# Patient Record
Sex: Male | Born: 1970 | Race: White | Hispanic: No | Marital: Single | Smoking: Current every day smoker
Health system: Southern US, Community
[De-identification: ages and names within clinical notes are randomized; demographics above are authoritative.]

## PROBLEM LIST (undated history)

## (undated) DIAGNOSIS — Z86718 Personal history of other venous thrombosis and embolism: Secondary | ICD-10-CM

---

## 2003-12-07 ENCOUNTER — Encounter: Payer: Self-pay | Admitting: Emergency Medicine

## 2003-12-07 ENCOUNTER — Emergency Department (HOSPITAL_COMMUNITY): Admission: EM | Admit: 2003-12-07 | Discharge: 2003-12-07 | Payer: Self-pay | Admitting: Emergency Medicine
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
## 2005-12-10 ENCOUNTER — Emergency Department: Payer: Self-pay | Admitting: Emergency Medicine

## 2010-02-04 ENCOUNTER — Emergency Department (HOSPITAL_COMMUNITY): Admission: EM | Admit: 2010-02-04 | Discharge: 2010-02-04 | Payer: Self-pay | Admitting: Emergency Medicine

## 2010-05-13 ENCOUNTER — Emergency Department (HOSPITAL_COMMUNITY): Admission: EM | Admit: 2010-05-13 | Discharge: 2010-05-13 | Payer: Self-pay | Admitting: Emergency Medicine

## 2010-10-23 ENCOUNTER — Emergency Department (HOSPITAL_COMMUNITY)
Admission: EM | Admit: 2010-10-23 | Discharge: 2010-10-23 | Disposition: A | Discharge status: Home / Self Care | Payer: No Typology Code available for payment source | Attending: Emergency Medicine | Admitting: Emergency Medicine

## 2010-10-23 DIAGNOSIS — Y92410 Unspecified street and highway as the place of occurrence of the external cause: Secondary | ICD-10-CM | POA: Insufficient documentation

## 2010-10-23 DIAGNOSIS — M545 Low back pain, unspecified: Secondary | ICD-10-CM | POA: Insufficient documentation

## 2010-10-23 DIAGNOSIS — M549 Dorsalgia, unspecified: Secondary | ICD-10-CM | POA: Insufficient documentation

## 2010-10-23 DIAGNOSIS — G8929 Other chronic pain: Secondary | ICD-10-CM | POA: Insufficient documentation

## 2010-10-23 DIAGNOSIS — T1490XA Injury, unspecified, initial encounter: Secondary | ICD-10-CM | POA: Insufficient documentation

## 2010-11-22 LAB — CBC
HCT: 47.8 % (ref 39.0–52.0)
MCH: 33.2 pg (ref 26.0–34.0)
MCHC: 34.5 g/dL (ref 30.0–36.0)
Platelets: 256 10*3/uL (ref 150–400)
RBC: 4.97 MIL/uL (ref 4.22–5.81)
RDW: 13.5 % (ref 11.5–15.5)
WBC: 11.4 10*3/uL — ABNORMAL HIGH (ref 4.0–10.5)

## 2010-11-22 LAB — POCT I-STAT, CHEM 8
BUN: 9 mg/dL (ref 6–23)
Calcium, Ion: 1.18 mmol/L (ref 1.12–1.32)
Creatinine, Ser: 1 mg/dL (ref 0.4–1.5)
Glucose, Bld: 129 mg/dL — ABNORMAL HIGH (ref 70–99)
HCT: 53 % — ABNORMAL HIGH (ref 39.0–52.0)
Hemoglobin: 18 g/dL — ABNORMAL HIGH (ref 13.0–17.0)
TCO2: 27 mmol/L (ref 0–100)

## 2010-11-22 LAB — URINALYSIS, ROUTINE W REFLEX MICROSCOPIC
Glucose, UA: NEGATIVE mg/dL
Ketones, ur: 40 mg/dL — AB
Specific Gravity, Urine: 1.022 (ref 1.005–1.030)
pH: 6.5 (ref 5.0–8.0)

## 2010-11-22 LAB — DIFFERENTIAL
Basophils Absolute: 0 10*3/uL (ref 0.0–0.1)
Lymphocytes Relative: 14 % (ref 12–46)
Lymphs Abs: 1.6 10*3/uL (ref 0.7–4.0)

## 2010-11-22 LAB — URINE MICROSCOPIC-ADD ON

## 2010-11-22 LAB — URINE CULTURE

## 2011-06-26 ENCOUNTER — Emergency Department (HOSPITAL_COMMUNITY)
Admission: EM | Admit: 2011-06-26 | Discharge: 2011-06-27 | Disposition: A | Discharge status: Home / Self Care | Payer: Self-pay | Attending: Emergency Medicine | Admitting: Emergency Medicine

## 2011-06-26 DIAGNOSIS — M542 Cervicalgia: Secondary | ICD-10-CM | POA: Insufficient documentation

## 2011-06-26 DIAGNOSIS — IMO0001 Reserved for inherently not codable concepts without codable children: Secondary | ICD-10-CM | POA: Insufficient documentation

## 2011-06-26 DIAGNOSIS — Z86718 Personal history of other venous thrombosis and embolism: Secondary | ICD-10-CM | POA: Insufficient documentation

## 2011-06-26 DIAGNOSIS — M79609 Pain in unspecified limb: Secondary | ICD-10-CM | POA: Insufficient documentation

## 2011-06-27 ENCOUNTER — Ambulatory Visit (HOSPITAL_COMMUNITY)
Admission: RE | Admit: 2011-06-27 | Discharge: 2011-06-27 | Disposition: A | Discharge status: Home / Self Care | Payer: Self-pay | Source: Ambulatory Visit | Attending: Emergency Medicine | Admitting: Emergency Medicine

## 2011-06-27 DIAGNOSIS — M542 Cervicalgia: Secondary | ICD-10-CM | POA: Insufficient documentation

## 2011-06-27 DIAGNOSIS — M79609 Pain in unspecified limb: Secondary | ICD-10-CM

## 2011-06-27 LAB — POCT I-STAT, CHEM 8
Chloride: 103 mEq/L (ref 96–112)
Creatinine, Ser: 0.8 mg/dL (ref 0.50–1.35)
HCT: 44 % (ref 39.0–52.0)
Hemoglobin: 15 g/dL (ref 13.0–17.0)
Potassium: 3.8 mEq/L (ref 3.5–5.1)
Sodium: 139 mEq/L (ref 135–145)

## 2011-06-27 LAB — CBC
HCT: 40.6 % (ref 39.0–52.0)
Hemoglobin: 14.2 g/dL (ref 13.0–17.0)
MCV: 92.9 fL (ref 78.0–100.0)
Platelets: 208 10*3/uL (ref 150–400)
RBC: 4.37 MIL/uL (ref 4.22–5.81)
WBC: 7.2 10*3/uL (ref 4.0–10.5)

## 2013-08-21 ENCOUNTER — Encounter (HOSPITAL_COMMUNITY): Payer: Self-pay | Admitting: Emergency Medicine

## 2013-08-21 ENCOUNTER — Emergency Department (HOSPITAL_COMMUNITY)
Admission: EM | Admit: 2013-08-21 | Discharge: 2013-08-22 | Disposition: A | Discharge status: Home / Self Care | Payer: Self-pay | Attending: Emergency Medicine | Admitting: Emergency Medicine

## 2013-08-21 DIAGNOSIS — S7000XA Contusion of unspecified hip, initial encounter: Secondary | ICD-10-CM | POA: Insufficient documentation

## 2013-08-21 DIAGNOSIS — Y929 Unspecified place or not applicable: Secondary | ICD-10-CM | POA: Insufficient documentation

## 2013-08-21 DIAGNOSIS — F1010 Alcohol abuse, uncomplicated: Secondary | ICD-10-CM | POA: Insufficient documentation

## 2013-08-21 DIAGNOSIS — Y939 Activity, unspecified: Secondary | ICD-10-CM | POA: Insufficient documentation

## 2013-08-21 DIAGNOSIS — F10929 Alcohol use, unspecified with intoxication, unspecified: Secondary | ICD-10-CM

## 2013-08-21 DIAGNOSIS — X58XXXA Exposure to other specified factors, initial encounter: Secondary | ICD-10-CM | POA: Insufficient documentation

## 2013-08-21 LAB — CBC WITH DIFFERENTIAL/PLATELET
Basophils Absolute: 0 10*3/uL (ref 0.0–0.1)
Basophils Relative: 0 % (ref 0–1)
Eosinophils Absolute: 0.4 10*3/uL (ref 0.0–0.7)
Eosinophils Relative: 4 % (ref 0–5)
Lymphs Abs: 3.2 10*3/uL (ref 0.7–4.0)
MCH: 32.5 pg (ref 26.0–34.0)
MCV: 91.6 fL (ref 78.0–100.0)
Monocytes Absolute: 0.5 10*3/uL (ref 0.1–1.0)
Neutrophils Relative %: 53 % (ref 43–77)
Platelets: 219 10*3/uL (ref 150–400)
RBC: 4.98 MIL/uL (ref 4.22–5.81)
RDW: 12.9 % (ref 11.5–15.5)
WBC: 8.7 10*3/uL (ref 4.0–10.5)

## 2013-08-21 MED ORDER — SODIUM CHLORIDE 0.9 % IV BOLUS (SEPSIS)
1000.0000 mL | Freq: Once | INTRAVENOUS | Status: AC
  Administered 2013-08-21: 1000 mL via INTRAVENOUS

## 2013-08-21 NOTE — ED Notes (Signed)
Patient undressed, rectal temperature checked, patient arousable with stimulation, remains unable to properly answer questions or follow commands. Rectal temp checked. Patient belongings placed in patient belonging bag (remains at bedside with coat). Patient with cell phone and wallet placed at nurses station, searched for identification (possible name [name] or Brett Manning-- registration notified).

## 2013-08-21 NOTE — ED Provider Notes (Addendum)
CSN: [id number]     Arrival date & time 08/21/13  2255 History   First MD Initiated Contact with Patient 08/21/13 2258     Chief Complaint  Patient presents with  . Alcohol Intoxication   (Consider location/radiation/quality/duration/timing/severity/associated sxs/prior Treatment) HPI Comments: Pt found by EMS on [place] sidewalk [place].  They state he smells of etoh but no other information known.  Patient is a 42 y.o. male presenting with intoxication. The history is provided by the EMS personnel. The history is limited by the condition of the patient and the absence of a caregiver.  Alcohol Intoxication    History reviewed. No pertinent past medical history. History reviewed. No pertinent past surgical history. No family history on file. History  Substance Use Topics  . Smoking status: Not on file  . Smokeless tobacco: Not on file  . Alcohol Use: Yes    Review of Systems  Unable to perform ROS   Allergies  Review of patient's allergies indicates not on file.  Home Medications  No current outpatient prescriptions on file. BP 91/54  Pulse 72  Resp 18  SpO2 100% Physical Exam  Nursing note and vitals reviewed. Constitutional: He appears well-developed and well-nourished. No distress.  HENT:  Head: Normocephalic and atraumatic.  Mouth/Throat: Oropharynx is clear and moist.  Eyes: Conjunctivae and EOM are normal. Pupils are equal, round, and reactive to light.  Neck: Normal range of motion. Neck supple.  Cardiovascular: Normal rate, regular rhythm and intact distal pulses.   No murmur heard. Pulmonary/Chest: Effort normal and breath sounds normal. No respiratory distress. He has no wheezes. He has no rales.  Abdominal: Soft. He exhibits no distension. There is no tenderness. There is no rebound and no guarding.  Musculoskeletal: Normal range of motion. He exhibits no edema and no tenderness.  Small ecchymosis over the left hip  Neurological:  Will open eyes with  painful stimuli  Skin: Skin is warm and dry. No rash noted. No erythema.  Psychiatric: He has a normal mood and affect. His behavior is normal.    ED Course  Procedures (including critical care time) Labs Review Labs Reviewed  ETHANOL - Abnormal; Notable for the following:    Alcohol, Ethyl (B) 411 (*)    All other components within normal limits  SALICYLATE LEVEL - Abnormal; Notable for the following:    Salicylate Lvl <2.0 (*)    All other components within normal limits  CBC WITH DIFFERENTIAL  URINE RAPID DRUG SCREEN (HOSP PERFORMED)  ACETAMINOPHEN LEVEL   Imaging Review No results found.  EKG Interpretation   None       MDM   1. Alcohol intoxication     Patient presents by EMS after being found on the sidewalk. Patient appears intoxicated does move all extremities and will open his eyes briefly but does not speak. Only a small bruise on his left hip but no other signs of trauma. Pupils are reactive. Suspect that patient is severely intoxicated also possible other drugs involved. Patient given IV bolus given blood pressure 91/54 but heart rate is stable and oxygen is 100%.  6:17 AM Pt is now sober and awake and denies SI/HI or OD but states he just drank to much.  Pt d/ced home.  Gwyneth Sprout, MD 08/22/13 7829  Gwyneth Sprout, MD 08/22/13 [phone]
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
## 2013-08-22 LAB — ETHANOL: Alcohol, Ethyl (B): 411 mg/dL (ref 0–11)

## 2013-08-22 LAB — RAPID URINE DRUG SCREEN, HOSP PERFORMED: Opiates: NOT DETECTED

## 2013-08-22 LAB — ACETAMINOPHEN LEVEL: Acetaminophen (Tylenol), Serum: <15 ug/mL (ref 10–30)

## 2013-08-22 LAB — SALICYLATE LEVEL: Salicylate Lvl: <2 mg/dL — ABNORMAL LOW (ref 2.8–20.0)

## 2013-08-22 NOTE — ED Notes (Addendum)
Patient d/c'd IV, ambulated to sink where he  voided. Patient returned to bed with assistance from nurse. Patient awake and alert. Name and DOB verified. Patient states he was drinking tonight when questioned how much he states "not very much", unable to recall where he was at, denies drug use. Denies SI/HI. Patient is not seeking detox.

## 2013-08-22 NOTE — ED Notes (Signed)
Patient remains in the bed, eyes closed, chest observed for rise and fall.

## 2013-08-22 NOTE — ED Notes (Signed)
Patient was found sitting in the floor of his room, naked. Patient had removed his gown and gotten up from his bed. Patient was returned to his bed with the assistance of 4 staff members. Patient remains uncooperative and unable to follow commands.

## 2013-08-22 NOTE — ED Notes (Signed)
Bed: WA10 Expected date:  Expected time:  Means of arrival:  Comments: 

## 2013-12-21 ENCOUNTER — Emergency Department (HOSPITAL_COMMUNITY)
Admission: EM | Admit: 2013-12-21 | Discharge: 2013-12-21 | Disposition: A | Discharge status: Home / Self Care | Payer: Self-pay | Attending: Emergency Medicine | Admitting: Emergency Medicine

## 2013-12-21 ENCOUNTER — Encounter (HOSPITAL_COMMUNITY): Payer: Self-pay | Admitting: Emergency Medicine

## 2013-12-21 ENCOUNTER — Emergency Department (HOSPITAL_COMMUNITY): Payer: Self-pay

## 2013-12-21 DIAGNOSIS — Y929 Unspecified place or not applicable: Secondary | ICD-10-CM | POA: Insufficient documentation

## 2013-12-21 DIAGNOSIS — IMO0002 Reserved for concepts with insufficient information to code with codable children: Secondary | ICD-10-CM | POA: Insufficient documentation

## 2013-12-21 DIAGNOSIS — Y990 Civilian activity done for income or pay: Secondary | ICD-10-CM | POA: Insufficient documentation

## 2013-12-21 DIAGNOSIS — Y939 Activity, unspecified: Secondary | ICD-10-CM | POA: Insufficient documentation

## 2013-12-21 DIAGNOSIS — R296 Repeated falls: Secondary | ICD-10-CM | POA: Insufficient documentation

## 2013-12-21 DIAGNOSIS — Z880 Allergy status to penicillin: Secondary | ICD-10-CM | POA: Insufficient documentation

## 2013-12-21 DIAGNOSIS — S63259A Unspecified dislocation of unspecified finger, initial encounter: Secondary | ICD-10-CM

## 2013-12-21 MED ORDER — OXYCODONE-ACETAMINOPHEN 5-325 MG PO TABS: 1.0000 | ORAL_TABLET | ORAL | Status: DC | PRN

## 2013-12-21 MED ORDER — OXYCODONE-ACETAMINOPHEN 5-325 MG PO TABS
1.0000 | ORAL_TABLET | Freq: Once | ORAL | Status: AC
  Administered 2013-12-21: 1 via ORAL
  Filled 2013-12-21: qty 1

## 2013-12-21 MED ORDER — IBUPROFEN 600 MG PO TABS: 600.0000 mg | ORAL_TABLET | Freq: Four times a day (QID) | ORAL | Status: DC | PRN

## 2013-12-21 NOTE — Discharge Instructions (Signed)
Pain medications as prescribed. Continue to have splint on. Although your finger alingment is normal, it is possible that you have damaged your tendons/ligaments in your finger. Make sure to follow up with hand specialist regarding these findings.     Finger Dislocation Finger dislocation is the displacement of bones in your finger at the joints. Most commonly, finger dislocation occurs at the proximal interphalangeal joint (the joint closest to your knuckle). Very strong, fibrous tissues (ligaments) and joint capsules connect the three bones of your fingers.  CAUSES Dislocation is caused by a forceful impact. This impact moves these bones off the joint and often tears your ligaments.  SYMPTOMS Symptoms of finger dislocation include:  Deformity of your finger.  Pain, with loss of movement. DIAGNOSIS  Finger dislocation is diagnosed with a physical exam. Often, X-ray exams are done to see if you have associated injuries, such as bone fractures. TREATMENT  Finger dislocations are treated by putting your bones back into position (reduction) either by manually moving the bones back into place or through surgery. Your finger is then kept in a fixed position (immobilized) with the use of a dressing or splint for a brief period. When your ligament has to be surgically repaired, it needs to be kept in a fixed position with a dressing or splint for 1 to 2 weeks. Because joint stiffness is a long-term complication of finger dislocation, hand exercises or physical therapy to increase the range of motion and to regain strength is usually started as soon as the ligament is healed. Exercises and therapy generally last no more than 3 months. HOME CARE INSTRUCTIONS The following measures can help to reduce pain and speed up the healing process:  Rest your injured joint. Do not move until instructed otherwise by your caregiver. Avoid activities similar to the one that caused your injury.  Apply ice to your  injured joint for the first day or 2 after your reduction or as directed by your caregiver. Applying ice helps to reduce inflammation and pain.  Put ice in a plastic bag.  Place a towel between your skin and the bag.  Leave the ice on for 15-20 minutes at a time, every 2 hours while you are awake.  Elevate your hand above your heart as directed by your caregiver to reduce swelling.  Take over-the-counter or prescription medicine for pain as your caregiver instructs you. SEEK IMMEDIATE MEDICAL CARE IF:  Your dressing or splint becomes damaged.  Your pain becomes worse rather than better.  You lose feeling in your finger, or it becomes cold and white. MAKE SURE YOU:  Understand these instructions.  Will watch your condition.  Will get help right away if you are not doing well or get worse. Document Released: 08/23/2000 Document Revised: 11/18/2011 Document Reviewed: 06/16/2011 [facility] Patient Information 2014 [place], [place].

## 2013-12-21 NOTE — ED Provider Notes (Signed)
CSN: [id number]     Arrival date & time 12/21/13  1810 History  This chart was scribed for non-physician practitioner working with Brett CamelScott T Goldston, MD by Brett Manning, ED Scribe. This patient was seen in room WTR8/WTR8 and the patient's care was started at 7:39 PM.   Chief Complaint  Patient presents with  . Finger Injury      The history is provided by the patient. No language interpreter was used.   HPI Comments: Brett Manning is Manning 43 y.o. male who presents to the Emergency Department complaining of throbbing, aching left fifth finger pain due to fall eight days ago. Patient reports falling and dislocating his finger while at work. He replaced the bone and after work purchased Manning splint to place on the finger. Despite splint placement and taking ibuprofen patient reports the pain is worsening and the swelling has remained unchanged since the injury.    History reviewed. No pertinent past medical history. History reviewed. No pertinent past surgical history. History reviewed. No pertinent family history. History  Substance Use Topics  . Smoking status: Not on file  . Smokeless tobacco: Not on file  . Alcohol Use: Yes    Review of Systems  Constitutional: Negative for fever.  Musculoskeletal: Positive for arthralgias and joint swelling.  Skin: Negative for wound.      Allergies  Penicillins and Vicodin  Home Medications   Prior to Admission medications   Not on File   Triage Vitals: BP 129/81  Pulse 75  Temp(Src) 98 F (36.7 C) (Oral)  SpO2 97% Physical Exam  Nursing note and vitals reviewed. Constitutional: He is oriented to person, place, and time. He appears well-developed and well-nourished. No distress.  HENT:  Head: Normocephalic and atraumatic.  Eyes: EOM are normal.  Neck: Neck supple. No tracheal deviation present.  Cardiovascular: Normal rate.   Pulmonary/Chest: Effort normal. No respiratory distress.  Musculoskeletal: Normal range of motion.   Swelling and deformity to the middle phalanx and PIP joint of the left 5th finger. Unable to move finger at that join. Capillary refill less than 2 seconds distally. Sensation intact distally.  Neurological: He is alert and oriented to person, place, and time.  Skin: Skin is warm and dry.  Psychiatric: He has Manning normal mood and affect. His behavior is normal.    ED Course  Reduction of dislocation Date/Time: 12/21/2013 6:40 PM Performed by: Brett Manning, Brett Manning Authorized by: Brett Manning, Brett Manning Consent: Verbal consent obtained. written consent not obtained. Consent given by: patient Patient understanding: patient states understanding of the procedure being performed Patient identity confirmed: verbally with patient Local anesthesia used: yes Anesthesia: nerve block Patient sedated: no Patient tolerance: Patient tolerated the procedure well with no immediate complications. Comments: Closed reduction of left fifth finger at PIP joint   (including critical care time) DIAGNOSTIC STUDIES: Oxygen Saturation is 97% on room air, adequate by my interpretation.    COORDINATION OF CARE: 7:39 PM- Discussed treatment plan with patient at bedside and patient agreed to plan.    Labs Review Labs Reviewed - No data to display  Imaging Review No results found. DG Finger Little Left (Final result)  Result time: 12/21/13 20:38:09    Final result by Rad Results In Interface (12/21/13 20:38:09)    Narrative:   CLINICAL DATA: Small digit proximal interphalangeal joint dislocation, status postreduction.  EXAM: LEFT LITTLE FINGER 2+V  COMPARISON: DG FINGER LITTLE*L* dated 12/21/2013 at 7:38 p.m.  FINDINGS: Although no longer frankly dislocated, the middle  phalanx remains dorsally subluxed 2 mm with respect to the proximal phalanx. Suspected small volar plate avulsion from the base of the middle phalanx.  Surrounding soft tissue swelling observed.  IMPRESSION: 1. Although no longer  frankly dislocated, the small finger middle phalanx remains dorsally subluxed by 2 mm. 2. Subtle volar plate avulsion from the base of the middle phalanx.   Electronically Signed By: Brett Manning M.D. On: 12/21/2013 20:38             DG Finger Little Left (Final result)  Result time: 12/21/13 19:46:26    Final result by Rad Results In Interface (12/21/13 19:46:26)    Narrative:   CLINICAL DATA: Fall with left fifth finger injury.  EXAM: LEFT LITTLE FINGER 2+V  COMPARISON: None.  FINDINGS: Dorsal dislocation of the middle phalanx is identified relative to the proximal phalanx. There is some overriding of the 2 bones without visible fracture. Soft tissues are unremarkable.  IMPRESSION: Dorsal dislocation at the level of the left fifth PIP joint. The middle phalanx is dorsally dislocated relative to the proximal phalanx.   Electronically Signed By: Brett Manning M.D. On: 12/21/2013 19:46     EKG Interpretation None      NERVE BLOCK Performed by: Myriam Jacobsonatyana Manning Bonne Whack Consent: Verbal consent obtained. Required items: required blood products, implants, devices, and special equipment available Time out: Immediately prior to procedure Manning "time out" was called to verify the correct patient, procedure, equipment, support staff and site/side marked as required.  Indication: finger dislocation Nerve block body site: left 5th finger  Preparation: Patient was prepped and draped in the usual sterile fashion. Needle gauge: 25 G Location technique: anatomical landmarks  Local anesthetic: lidocaine 2% wo epi  Anesthetic total: 4 ml  Outcome: pain improved Patient tolerance: Patient tolerated the procedure well with no immediate complications.    MDM   Final diagnoses:  Finger dislocation    Patient with finger injury one week ago, persistent pain since then. X-ray showed dislocation of the PIP joint. Reduced after  nerve block.Resplinted.  Discharge home  with Manning hand specialist followup.  I personally performed the services described in this documentation, which was scribed in my presence. The recorded information has been reviewed and is accurate.   Lottie Musselatyana Manning Sayuri Rhames, PA-C 12/29/13 [phone]

## 2013-12-21 NOTE — ED Provider Notes (Signed)
[place].  Results of reduction film discussed w/ patient.  I treated pain w/ 1 percocet.  Advised close f/u w/ Dr. Merlyn LotKuzma.  8:56 PM   Otilio Miuatherine E Dixon Luczak, PA-C 12/21/13 2056

## 2013-12-21 NOTE — ED Notes (Signed)
Pt has a ride home.  

## 2013-12-21 NOTE — ED Notes (Signed)
Pt states that he broke his L pinky finger at work 1 week ago and set it back himself. States it has progressively gotten more painful. Alert and oriented.

## 2013-12-23 NOTE — ED Provider Notes (Signed)
Medical screening examination/treatment/procedure(s) were performed by non-physician practitioner and as supervising physician I was immediately available for consultation/collaboration.   EKG Interpretation None        [place]remont Gavitt T Kervens Roper, MD 12/23/13 1630 

## 2013-12-24 ENCOUNTER — Emergency Department (HOSPITAL_COMMUNITY)
Admission: EM | Admit: 2013-12-24 | Discharge: 2013-12-24 | Disposition: A | Discharge status: Home / Self Care | Payer: Self-pay | Attending: Emergency Medicine | Admitting: Emergency Medicine

## 2013-12-24 ENCOUNTER — Encounter (HOSPITAL_COMMUNITY): Payer: Self-pay | Admitting: Emergency Medicine

## 2013-12-24 DIAGNOSIS — F17200 Nicotine dependence, unspecified, uncomplicated: Secondary | ICD-10-CM | POA: Insufficient documentation

## 2013-12-24 DIAGNOSIS — M79642 Pain in left hand: Secondary | ICD-10-CM

## 2013-12-24 DIAGNOSIS — Z7982 Long term (current) use of aspirin: Secondary | ICD-10-CM | POA: Insufficient documentation

## 2013-12-24 DIAGNOSIS — M79609 Pain in unspecified limb: Secondary | ICD-10-CM | POA: Insufficient documentation

## 2013-12-24 DIAGNOSIS — Z880 Allergy status to penicillin: Secondary | ICD-10-CM | POA: Insufficient documentation

## 2013-12-24 DIAGNOSIS — G8911 Acute pain due to trauma: Secondary | ICD-10-CM | POA: Insufficient documentation

## 2013-12-24 DIAGNOSIS — M25549 Pain in joints of unspecified hand: Secondary | ICD-10-CM | POA: Insufficient documentation

## 2013-12-24 MED ORDER — OXYCODONE-ACETAMINOPHEN 5-325 MG PO TABS: 2.0000 | ORAL_TABLET | ORAL | Status: DC | PRN

## 2013-12-24 NOTE — ED Provider Notes (Signed)
CSN: [id number]     Arrival date & time 12/24/13  [id number] History   First MD Initiated Contact with Patient 12/24/13 [phone]     Chief Complaint  Patient presents with  . Hand Pain     (Consider location/radiation/quality/duration/timing/severity/associated sxs/prior Treatment) HPI Comments: 43 year old male with recent fifth finger fracture and dislocation currently in a splint presents with worsening left finger pain. Patient was seen on the 14th an original injury was on the eighth of this month. Patient has followup appointment for the next week however pain is worsening. No fevers chills or vomiting. Patient taking ibuprofen as well however pain is more severe. No streaking redness or discharge.  Patient is a 43 y.o. male presenting with hand pain. The history is provided by the patient.  Hand Pain This is a recurrent problem.    History reviewed. No pertinent past medical history. History reviewed. No pertinent past surgical history. No family history on file. History  Substance Use Topics  . Smoking status: Current Every Day Smoker -- 0.50 packs/day    Types: Cigarettes  . Smokeless tobacco: Never Used  . Alcohol Use: Yes     Comment: socially    Review of Systems  Constitutional: Negative for fever and chills.  Gastrointestinal: Negative for vomiting.  Musculoskeletal: Positive for arthralgias.  Skin: Negative for wound.  Neurological: Negative for weakness and numbness.      Allergies  Penicillins and Vicodin  Home Medications   Prior to Admission medications   Medication Sig Start Date End Date Taking? Authorizing Provider  aspirin EC 81 MG tablet Take 81 mg by mouth daily.   Yes Historical Provider, MD  ibuprofen (ADVIL,MOTRIN) 600 MG tablet Take 1 tablet (600 mg total) by mouth every 6 (six) hours as needed. 12/21/13  Yes Tatyana A Kirichenko, PA-C  oxyCODONE-acetaminophen (PERCOCET/ROXICET) 5-325 MG per tablet Take 1 tablet by mouth every 4 (four) hours as needed  for severe pain. 12/21/13  Yes Tatyana A Kirichenko, PA-C  oxyCODONE-acetaminophen (PERCOCET) 5-325 MG per tablet Take 2 tablets by mouth every 4 (four) hours as needed. 12/24/13   Enid SkeensJoshua M Netra Postlethwait, MD   BP 128/88  Pulse 80  Temp(Src) 97.8 F (36.6 C) (Oral)  Resp 18  SpO2 100% Physical Exam  Nursing note and vitals reviewed. Constitutional: He is oriented to person, place, and time. He appears well-developed and well-nourished. No distress.  Neck: Normal range of motion.  Cardiovascular: Normal rate.   Musculoskeletal: He exhibits tenderness. He exhibits no edema.  Patient has splint on left small finger sensation distally intact good cap refill in tenderness at the DIP joint. Tears range of motion due to splinting pain.  Neurological: He is alert and oriented to person, place, and time.  Skin: Skin is warm.    ED Course  Procedures (including critical care time) Labs Review Labs Reviewed - No data to display  Imaging Review No results found.   EKG Interpretation None      MDM   Final diagnoses:  Left hand pain   Patient well-appearing with no systemic symptoms. Patient does not have a primary doctor and I suggested that he try to find one. Patient has followup with orthopedic. Plan for 10 Percocets to get him until appointment this is his second round of pain meds and he is advised to continue taking ibuprofen and not to expect pain to go to 0/10.  Results and differential diagnosis were discussed with the patient. Close follow up outpatient was discussed, patient comfortable  with the plan.   Filed Vitals:   12/24/13 0722  BP: 128/88  Pulse: 80  Temp: 97.8 F (36.6 C)  TempSrc: Oral  Resp: 18  SpO2: 100%       Enid SkeensJoshua M Gerald Honea, MD 12/24/13 [phone]

## 2013-12-24 NOTE — ED Notes (Signed)
Pt was seen here 12/21/13 for finger dislocation. Pt was told to return for worsening pain and/or inability to schedule appointment within a week. Pt reports appointment on this coming Wednesday but worsening pain. Pt reports 10/10. Pt reports full sensation in finger/left pinky.

## 2013-12-24 NOTE — Discharge Instructions (Signed)
If you were given medicines take as directed.  If you are on coumadin or contraceptives realize their levels and effectiveness is altered by many different medicines.  If you have any reaction (rash, tongues swelling, other) to the medicines stop taking and see a physician.  For severe pain take percocet however realize they have the potential for addiction and it can make you sleepy and has tylenol in it.  No operating machinery while taking.   Please follow up as directed and return to the ER or see a physician for new or worsening symptoms.  Thank you. Filed Vitals:   12/24/13 0722  BP: 128/88  Pulse: 80  Temp: 97.8 F (36.6 C)  TempSrc: Oral  Resp: 18  SpO2: 100%

## 2013-12-24 NOTE — ED Notes (Signed)
Pt escorted to discharge window. Verbalized understanding discharge instructions. In no acute distress.   

## 2013-12-29 NOTE — ED Provider Notes (Signed)
Medical screening examination/treatment/procedure(s) were conducted as a shared visit with non-physician practitioner(s) and myself.  I personally evaluated the patient during the encounter.   EKG Interpretation None      Patient with prolonged dislocation of finger. NV intact. Difficult to reduce, but with nerve block and multiple providers it was reduced although still mildly subluxed. Will d/c with pain control and follow up with Hand.  Audree CamelScott T Bahar Shelden, MD 12/29/13 [phone]

## 2017-01-22 ENCOUNTER — Emergency Department (HOSPITAL_COMMUNITY)
Admission: EM | Admit: 2017-01-22 | Discharge: 2017-01-22 | Disposition: A | Discharge status: Home / Self Care | Payer: Self-pay | Attending: Emergency Medicine | Admitting: Emergency Medicine

## 2017-01-22 ENCOUNTER — Emergency Department (HOSPITAL_COMMUNITY): Payer: Self-pay

## 2017-01-22 ENCOUNTER — Encounter (HOSPITAL_COMMUNITY): Payer: Self-pay

## 2017-01-22 DIAGNOSIS — R0789 Other chest pain: Secondary | ICD-10-CM

## 2017-01-22 DIAGNOSIS — Z79899 Other long term (current) drug therapy: Secondary | ICD-10-CM | POA: Insufficient documentation

## 2017-01-22 DIAGNOSIS — T40601A Poisoning by unspecified narcotics, accidental (unintentional), initial encounter: Secondary | ICD-10-CM

## 2017-01-22 DIAGNOSIS — F17210 Nicotine dependence, cigarettes, uncomplicated: Secondary | ICD-10-CM | POA: Insufficient documentation

## 2017-01-22 DIAGNOSIS — Z7982 Long term (current) use of aspirin: Secondary | ICD-10-CM | POA: Insufficient documentation

## 2017-01-22 HISTORY — DX: Personal history of other venous thrombosis and embolism: Z86.718

## 2017-01-22 LAB — BASIC METABOLIC PANEL
Anion gap: 5 (ref 5–15)
BUN: 9 mg/dL (ref 6–20)
CHLORIDE: 107 mmol/L (ref 101–111)
CO2: 27 mmol/L (ref 22–32)
CREATININE: 0.91 mg/dL (ref 0.61–1.24)
Calcium: 9.4 mg/dL (ref 8.9–10.3)
GFR calc non Af Amer: >60 mL/min (ref >60)
Glucose, Bld: 116 mg/dL — ABNORMAL HIGH (ref 65–99)
POTASSIUM: 3.7 mmol/L (ref 3.5–5.1)
SODIUM: 139 mmol/L (ref 135–145)

## 2017-01-22 LAB — CBC
HEMATOCRIT: 37.5 % — AB (ref 39.0–52.0)
Hemoglobin: 12.8 g/dL — ABNORMAL LOW (ref 13.0–17.0)
MCH: 31.1 pg (ref 26.0–34.0)
MCHC: 34.1 g/dL (ref 30.0–36.0)
MCV: 91.2 fL (ref 78.0–100.0)
PLATELETS: 217 10*3/uL (ref 150–400)
RBC: 4.11 MIL/uL — ABNORMAL LOW (ref 4.22–5.81)
RDW: 13 % (ref 11.5–15.5)
WBC: 7.3 10*3/uL (ref 4.0–10.5)

## 2017-01-22 LAB — I-STAT TROPONIN, ED: Troponin i, poc: 0 ng/mL (ref 0.00–0.08)

## 2017-01-22 NOTE — ED Provider Notes (Signed)
[facility] Provider Note   CSN: [id number] Arrival date & time: 01/22/17  1425     History   Chief Complaint Chief Complaint  Patient presents with  . Chest Pain    HPI Brett Manning is a 46 y.o. male.  HPI Patient reports that he overdose earlier today on OxyContin. He reports he ingested it. A friend was with him and he gave him 4 mg of Narcan. Patient was under arrest and being transported by police when he became "unresponsive". Patient recalls being in the police car and reports that he developed some chest pain while he was in there. He reports the chest pain is left-sided sharp and stabbing. It comes and goes in waves. He reports he does intermittently get similar types of chest pain but this was worse today. No nausea or sweating or shortness of breath with chest pain. Past Medical History:  Diagnosis Date  . H/O blood clots     There are no active problems to display for this patient.   History reviewed. No pertinent surgical history.     Home Medications    Prior to Admission medications   Medication Sig Start Date End Date Taking? Authorizing Provider  aspirin EC 81 MG tablet Take 81 mg by mouth daily.    [provider]  ibuprofen (ADVIL,MOTRIN) 600 MG tablet Take 1 tablet (600 mg total) by mouth every 6 (six) hours as needed. 12/21/13   Kirichenko, Lemont Fillers, PA-C  oxyCODONE-acetaminophen (PERCOCET) 5-325 MG per tablet Take 2 tablets by mouth every 4 (four) hours as needed. 12/24/13   Blane Ohara, MD  oxyCODONE-acetaminophen (PERCOCET/ROXICET) 5-325 MG per tablet Take 1 tablet by mouth every 4 (four) hours as needed for severe pain. 12/21/13   Jaynie Crumble, PA-C    Family History No family history on file.  Social History Social History  Substance Use Topics  . Smoking status: Current Every Day Smoker    Packs/day: 0.50    Types: Cigarettes  . Smokeless tobacco: Never Used  . Alcohol use Yes     Comment: socially      Allergies   Penicillins and Vicodin [hydrocodone-acetaminophen]   Review of Systems Review of Systems  10 Systems reviewed and are negative for acute change except as noted in the HPI.  Physical Exam Updated Vital Signs BP (!) 99/54 (BP Location: Left Arm)   Pulse 68   Temp 97.7 F (36.5 C) (Oral)   Resp 18   SpO2 97%   Physical Exam  Constitutional: He is oriented to person, place, and time. He appears well-developed and well-nourished.  HENT:  Head: Normocephalic and atraumatic.  Mouth/Throat: Oropharynx is clear and moist.  Eyes: Conjunctivae and EOM are normal.  Neck: Neck supple.  Cardiovascular: Normal rate, regular rhythm, normal heart sounds and intact distal pulses.   No murmur heard. Pulmonary/Chest: Effort normal. No respiratory distress. He exhibits tenderness.  Left anterior chest pain to palpation approximately ribs 5 through 7. No visible contusion or abrasion. Occasional expiratory wheeze but adequate air flow to bases bilaterally. No respiratory distress at rest.  Abdominal: Soft. He exhibits no distension. There is no tenderness.  Musculoskeletal: Normal range of motion. He exhibits no edema or tenderness.  Neurological: He is alert and oriented to person, place, and time. No cranial nerve deficit. He exhibits normal muscle tone. Coordination normal.  Skin: Skin is warm and dry.  Psychiatric: He has a normal mood and affect.  Nursing note and vitals reviewed.    ED  Treatments / Results  Labs (all labs ordered are listed, but only abnormal results are displayed) Labs Reviewed  BASIC METABOLIC PANEL - Abnormal; Notable for the following:       Result Value   Glucose, Bld 116 (*)    All other components within normal limits  CBC - Abnormal; Notable for the following:    RBC 4.11 (*)    Hemoglobin 12.8 (*)    HCT 37.5 (*)    All other components within normal limits  I-STAT TROPOININ, ED    EKG  EKG Interpretation  Date/Time:  Wednesday  Jan 22 2017 14:28:26 EDT Ventricular Rate:  70 PR Interval:  202 QRS Duration: 90 QT Interval:  378 QTC Calculation: 408 R Axis:   58 Text Interpretation:  Normal sinus rhythm no acute ischemic appearance. no old comparison. normal  Confirmed by Arby BarrettePfeiffer, Kavitha Lansdale [phone]([id number]) on 01/22/2017 3:16:16 PM       Radiology Dg Chest 2 View  Result Date: 01/22/2017 CLINICAL DATA:  Left chest pain and shortness of breath. EXAM: CHEST  2 VIEW COMPARISON:  12/07/2003 chest radiograph FINDINGS: The cardiomediastinal silhouette is unremarkable. There is no evidence of focal airspace disease, pulmonary edema, suspicious pulmonary nodule/mass, pleural effusion, or pneumothorax. No acute bony abnormalities are identified. IMPRESSION: No active cardiopulmonary disease. Electronically Signed   By: Harmon PierJeffrey  Hu M.D.   On: 01/22/2017 15:32    Procedures Procedures (including critical care time)  Medications Ordered in ED Medications - No data to display   Initial Impression / Assessment and Plan / ED Course  I have reviewed the triage vital signs and the nursing notes.  Pertinent labs & imaging results that were available during my care of the patient were reviewed by me and considered in my medical decision making (see chart for details).      Final Clinical Impressions(s) / ED Diagnoses   Final diagnoses:  Atypical chest pain  Overdose of opiate or related narcotic, accidental or unintentional, initial encounter  Patient's chest pain atypical for ischemia. Vital signs are stable and patient is otherwise clinically well. Now time of discharge she is sitting up in the bed alert in no distress and well and appearance. Chest x-ray troponin EKG within normal limits. Patient had unintentional opioid overdose from oral medications earlier today. A friend had administered Narcan. At this time no indication the patient will have rebound opioid sedation or overdose effect. Patient is stable for discharge.  New  Prescriptions New Prescriptions   No medications on file     Arby BarrettePfeiffer, Jorita Bohanon, MD 01/22/17 1700

## 2017-01-22 NOTE — ED Triage Notes (Signed)
Pt arrives via ems after becoming "unresponsive" in cop car while being transported to jail. In route to ED pt began having left sided chest pain. Pt reports taking unknown amount of OxyContin and received 4mg  narcan in from friend. When ems arrived pt was "unresponsive" but aroused to painful stimuli. PT received  324 asa and 1sl ntg with EMS. Pain unchanged. No diaphoresis/ sob with chest pain.   18 LAC  112/60 Hr-80 sp02-98%  cbg 153

## 2017-09-03 ENCOUNTER — Emergency Department (HOSPITAL_COMMUNITY)
Admission: EM | Admit: 2017-09-03 | Discharge: 2017-09-04 | Disposition: A | Discharge status: Home / Self Care | Payer: Self-pay | Attending: Emergency Medicine | Admitting: Emergency Medicine

## 2017-09-03 ENCOUNTER — Other Ambulatory Visit: Payer: Self-pay

## 2017-09-03 ENCOUNTER — Encounter (HOSPITAL_COMMUNITY): Payer: Self-pay

## 2017-09-03 DIAGNOSIS — F17210 Nicotine dependence, cigarettes, uncomplicated: Secondary | ICD-10-CM | POA: Insufficient documentation

## 2017-09-03 DIAGNOSIS — M79604 Pain in right leg: Secondary | ICD-10-CM

## 2017-09-03 DIAGNOSIS — M79661 Pain in right lower leg: Secondary | ICD-10-CM | POA: Insufficient documentation

## 2017-09-03 DIAGNOSIS — M7989 Other specified soft tissue disorders: Secondary | ICD-10-CM | POA: Insufficient documentation

## 2017-09-03 DIAGNOSIS — Z7982 Long term (current) use of aspirin: Secondary | ICD-10-CM | POA: Insufficient documentation

## 2017-09-03 NOTE — ED Triage Notes (Signed)
Pt arrives states he believes he has a blood clot in right leg; pt c/o heaviness to leg with pain and swelling; pt states hx of blood clot in arm in 1999 and has the same pain and sx; pt states pain has been for the past 4 days; pt states he is able to walk on leg but has extreme pain. Pt states sitting it is 7/10 but increase to 10/10 with standing or walking; Pt denies SOB; pt states a&ox 4 on arrival-Monique,RN

## 2017-09-04 ENCOUNTER — Ambulatory Visit (HOSPITAL_COMMUNITY): Payer: Self-pay

## 2017-09-04 ENCOUNTER — Ambulatory Visit (HOSPITAL_COMMUNITY)
Admission: RE | Admit: 2017-09-04 | Discharge: 2017-09-04 | Disposition: A | Discharge status: Home / Self Care | Payer: Self-pay | Source: Ambulatory Visit | Attending: Emergency Medicine | Admitting: Emergency Medicine

## 2017-09-04 DIAGNOSIS — M79604 Pain in right leg: Secondary | ICD-10-CM | POA: Insufficient documentation

## 2017-09-04 DIAGNOSIS — M79609 Pain in unspecified limb: Secondary | ICD-10-CM

## 2017-09-04 DIAGNOSIS — M7989 Other specified soft tissue disorders: Secondary | ICD-10-CM

## 2017-09-04 LAB — I-STAT CHEM 8, ED
BUN: 20 mg/dL (ref 6–20)
Calcium, Ion: 0.91 mmol/L — ABNORMAL LOW (ref 1.15–1.40)
Chloride: 106 mmol/L (ref 101–111)
Creatinine, Ser: 0.7 mg/dL (ref 0.61–1.24)
GLUCOSE: 97 mg/dL (ref 65–99)
HCT: 42 % (ref 39.0–52.0)
Hemoglobin: 14.3 g/dL (ref 13.0–17.0)
POTASSIUM: 4.7 mmol/L (ref 3.5–5.1)
SODIUM: 136 mmol/L (ref 135–145)
TCO2: 22 mmol/L (ref 22–32)

## 2017-09-04 MED ORDER — ENOXAPARIN SODIUM 80 MG/0.8ML ~~LOC~~ SOLN
1.0000 mg/kg | Freq: Once | SUBCUTANEOUS | Status: AC
  Administered 2017-09-04: 70 mg via SUBCUTANEOUS
  Filled 2017-09-04: qty 0.8

## 2017-09-04 NOTE — ED Provider Notes (Signed)
[facility] EMERGENCY [facility]PART[facility] Provider Note   CSN: [id number] Arrival date & time: 09/03/17  2031     History   Chief Complaint Chief Complaint  Patient presents with  . Leg Swelling    possible DVT     HPI Brett Manning is a 46 y.o. male.  The history is provided by the patient and medical records.     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  Reports about a week ago he sustained a injury to the calf as he feels like he "pulled a muscle".  States it started getting better but about 3 days ago he noticed some worsening pain along the right lower leg and shin.  States leg has felt warm to touch and has looked more swollen than normal which was not present when he initially injured his leg.  States when he walks the pain is "excruciating".  He denies any numbness or weakness of the right leg.  Reports his DVT in the past was provoked by injury and he is concerned for same.  No history of PE.  Denies any current chest pain or shortness of breath.  No fever or chills.  Past Medical History:  Diagnosis Date  . H/O blood clots     There are no active problems to display for this patient.   History reviewed. No pertinent surgical history.     Home Medications    Prior to Admission medications   Medication Sig Start Date End Date Taking? Authorizing Provider  aspirin EC 81 MG tablet Take 81 mg by mouth daily.    [provider]  ibuprofen (ADVIL,MOTRIN) 600 MG tablet Take 1 tablet (600 mg total) by mouth every 6 (six) hours as needed. Patient not taking: Reported on 09/03/2017 12/21/13   Jaynie CrumbleKirichenko, Tatyana, PA-C  oxyCODONE-acetaminophen (PERCOCET) 5-325 MG per tablet Take 2 tablets by mouth every 4 (four) hours as needed. Patient not taking: Reported on 09/03/2017 12/24/13   Blane OharaZavitz, Joshua, MD  oxyCODONE-acetaminophen (PERCOCET/ROXICET) 5-325 MG per  tablet Take 1 tablet by mouth every 4 (four) hours as needed for severe pain. Patient not taking: Reported on 09/03/2017 12/21/13   Jaynie CrumbleKirichenko, Tatyana, PA-C    Family History No family history on file.  Social History Social History   Tobacco Use  . Smoking status: Current Every Day Smoker    Packs/day: 0.50    Types: Cigarettes  . Smokeless tobacco: Never Used  Substance Use Topics  . Alcohol use: Yes    Comment: socially  . Drug use: No     Allergies   Penicillins and Vicodin [hydrocodone-acetaminophen]   Review of Systems Review of Systems  Musculoskeletal: Positive for arthralgias.  All other systems reviewed and are negative.    Physical Exam Updated Vital Signs BP 103/65   Pulse (!) 59   Temp 98.6 F (37 C) (Oral)   Resp 18   SpO2 99%   Physical Exam  Constitutional: He is oriented to person, place, and time. He appears well-developed and well-nourished.  HENT:  Head: Normocephalic and atraumatic.  Mouth/Throat: Oropharynx is clear and moist.  Eyes: Conjunctivae and EOM are normal. Pupils are equal, round, and reactive to light.  Neck: Normal range of motion.  Cardiovascular: Normal rate, regular rhythm and normal heart sounds.  Pulmonary/Chest: Effort normal and breath sounds normal.  Abdominal: Soft. Bowel sounds are normal.  Musculoskeletal: Normal range  of motion.  Right leg with slight swelling along the distal lower calf compared with left, there is calf tenderness without palpable cord, leg is warm to the touch but no overlying erythema, induration, or signs of cellulitis; no bony tenderness; DP pulse intact, moving toes normally, normal sensation distally; achilles tendon appears intact; no muscle bulges or defects along the gastrocnemius No bony deformities of either leg; compartments soft and easily compressible  Neurological: He is alert and oriented to person, place, and time.  Skin: Skin is warm and dry.  Psychiatric: He has a normal mood  and affect.  Nursing note and vitals reviewed.    ED Treatments / Results  Labs (all labs ordered are listed, but only abnormal results are displayed) Labs Reviewed  I-STAT CHEM 8, ED - Abnormal; Notable for the following components:      Result Value   Calcium, Ion 0.91 (*)    All other components within normal limits    EKG  EKG Interpretation None       Radiology No results found.  Procedures Procedures (including critical care time)  Medications Ordered in ED Medications - No data to display   Initial Impression / Assessment and Plan / ED Course  I have reviewed the triage vital signs and the nursing notes.  Pertinent labs & imaging results that were available during my care of the patient were reviewed by me and considered in my medical decision making (see chart for details).  46 year old male presenting to the ED with right lower leg pain.  Reports a "muscle strain" earlier in the week, pain initially improving but got worse again.  Has history of DVT in the past in the setting of acute orthopedic injury as well.  Does have some mild swelling along the distal right calf compared with left leg.  There is calf tenderness without palpable cord.  Leg is warm to touch but no overlying erythema, induration, or other signs of cellulitis.  No open wounds or sores.  Leg is neurovascularly intact.  No bony deformities and I have low suspicion for fracture.  No signs/symptoms concerning for compartment syndrome. Unfortunately, vascular sonography not available at this hour.  Chemistry panel is reassuring.  Patient was given dose of subcu Lovenox, he will return in the morning to have venous Doppler performed.  If abnormal, he will be redirected back to the ED for appropriate management.  He has no chest pain or shortness of breath, have low suspicion for PE.  Patient acknowledged understanding of his care plan, he understands to return here for any new/acute changes.  Final  Clinical Impressions(s) / ED Diagnoses   Final diagnoses:  Right leg pain    ED Discharge Orders        Ordered    VAS US LOWER EXTREMITY VENOUS (DVT)     09/04/17 0202       Garlon HatchetSanders, Donna Silverman M, PA-C 09/04/17 [id number]    Zadie RhineWickline, Donald, MD 09/04/17 [phone]

## 2017-09-04 NOTE — Discharge Instructions (Signed)
I have placed order to have venous duplex done in the morning here at the hospital.  You will need to go to the imaging center to have this done.  If results of the duplex are abnormal/show DVT, we will initiate treatment from the ED. Return here for any new/acute changes.

## 2017-09-04 NOTE — Progress Notes (Signed)
Right lower extremity venous duplex completed. No evidence of a DVT,Superficial thrombosis, or Baker's cyst. Brett DeitersVirginia Merci Walthers, RVS 09/04/2017 4:01 PM

## 2017-11-15 ENCOUNTER — Encounter (HOSPITAL_COMMUNITY): Payer: Self-pay | Admitting: *Deleted

## 2017-11-15 ENCOUNTER — Ambulatory Visit (HOSPITAL_COMMUNITY): Payer: Self-pay

## 2017-11-15 ENCOUNTER — Other Ambulatory Visit: Payer: Self-pay

## 2017-11-15 ENCOUNTER — Ambulatory Visit (INDEPENDENT_AMBULATORY_CARE_PROVIDER_SITE_OTHER): Payer: Self-pay

## 2017-11-15 ENCOUNTER — Ambulatory Visit (HOSPITAL_COMMUNITY)
Admission: EM | Admit: 2017-11-15 | Discharge: 2017-11-15 | Disposition: A | Discharge status: Home / Self Care | Payer: Self-pay | Attending: Family Medicine | Admitting: Family Medicine

## 2017-11-15 DIAGNOSIS — R51 Headache: Secondary | ICD-10-CM

## 2017-11-15 DIAGNOSIS — M25511 Pain in right shoulder: Secondary | ICD-10-CM

## 2017-11-15 DIAGNOSIS — M542 Cervicalgia: Secondary | ICD-10-CM

## 2017-11-15 DIAGNOSIS — M545 Low back pain: Secondary | ICD-10-CM

## 2017-11-15 MED ORDER — PREDNISONE 20 MG PO TABS: 40.0000 mg | ORAL_TABLET | Freq: Every day | ORAL | 0 refills | Status: AC

## 2017-11-15 MED ORDER — MELOXICAM 7.5 MG PO TABS: 7.5000 mg | ORAL_TABLET | Freq: Every day | ORAL | 0 refills | Status: AC

## 2017-11-15 MED ORDER — CYCLOBENZAPRINE HCL 10 MG PO TABS: 5.0000 mg | ORAL_TABLET | Freq: Every evening | ORAL | 0 refills | Status: AC | PRN
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
## 2018-04-16 IMAGING — DX DG CHEST 2V
2 series · 2 of 2 positions shown · non-contrast
Comparison: 12/07/2003 chest radiograph

CLINICAL DATA: Left chest pain and shortness of breath.

EXAM:
CHEST  2 VIEW

[chest pa]
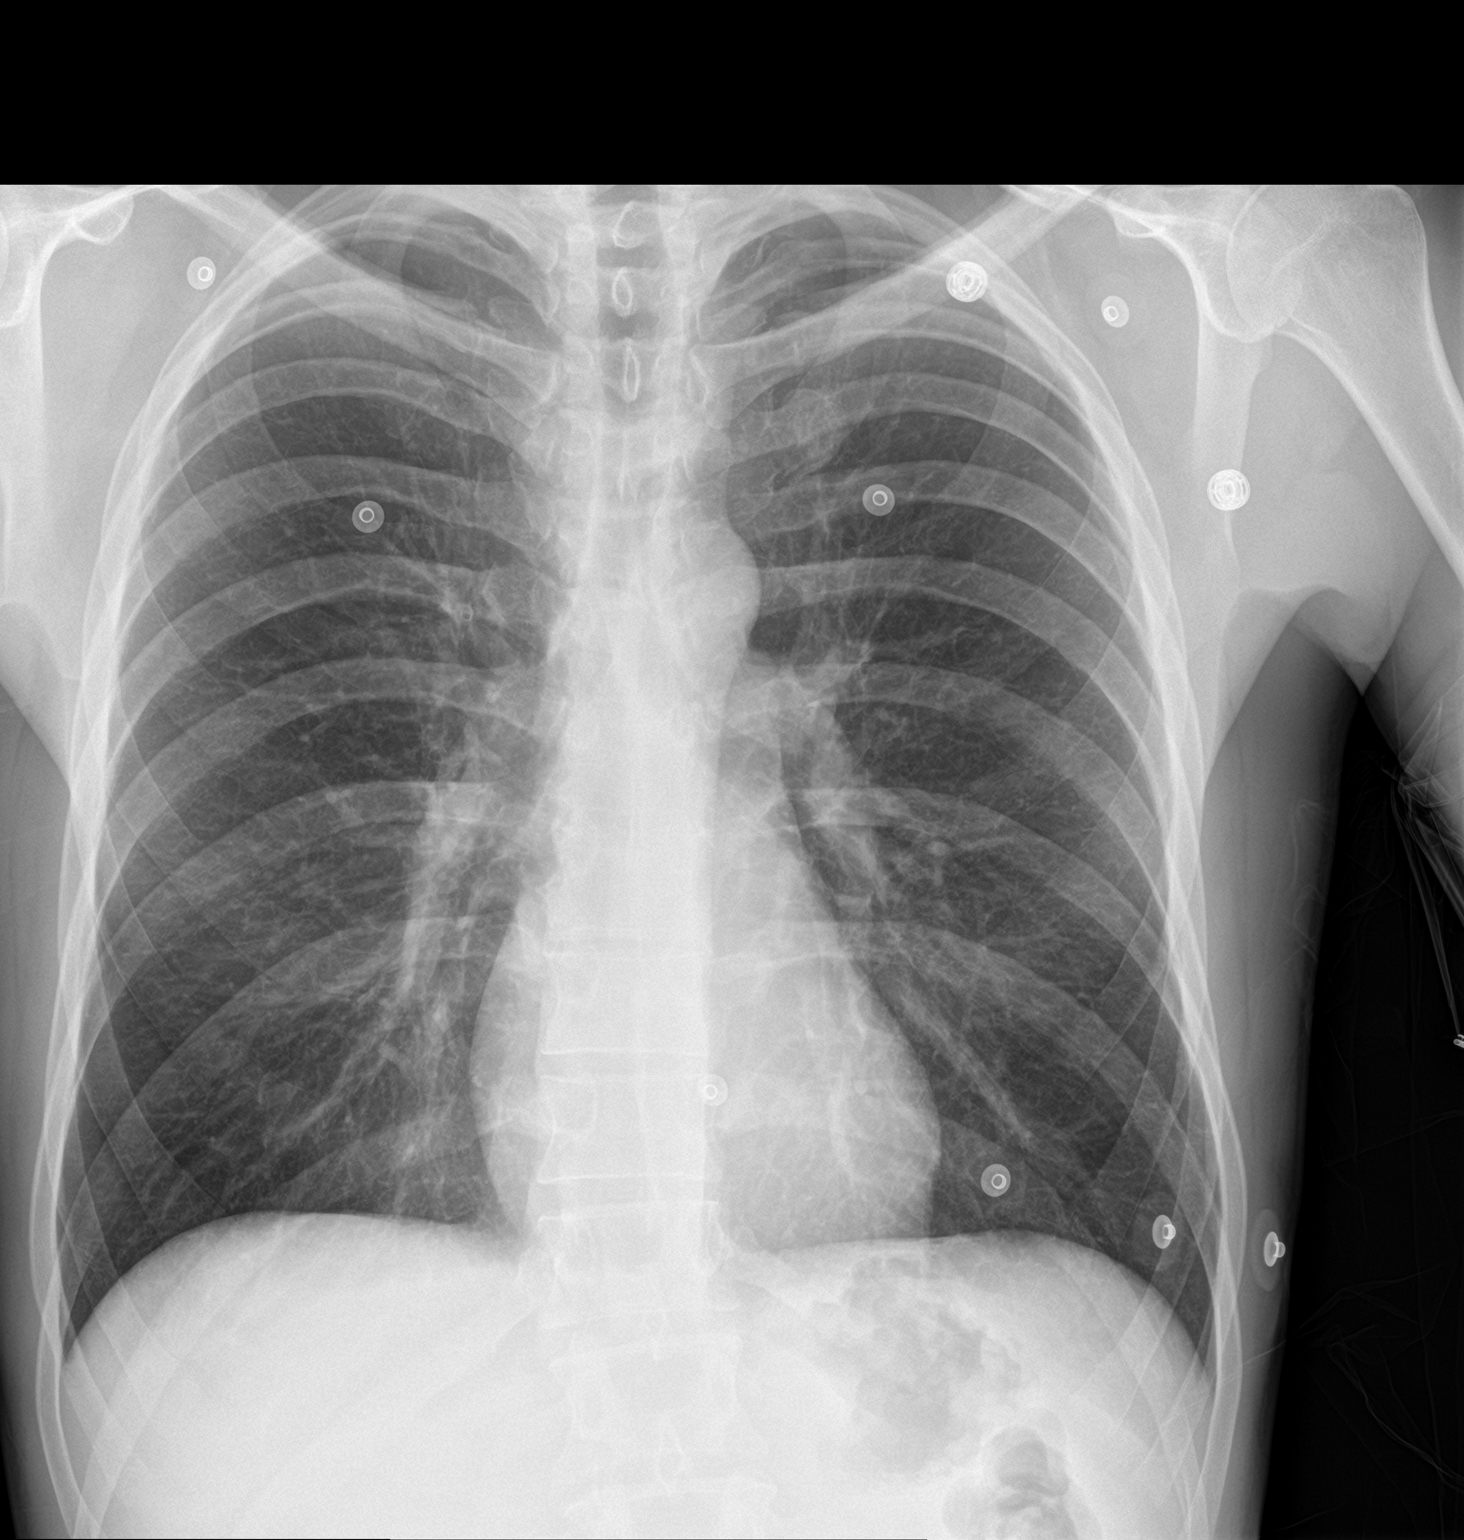

[chest lat]
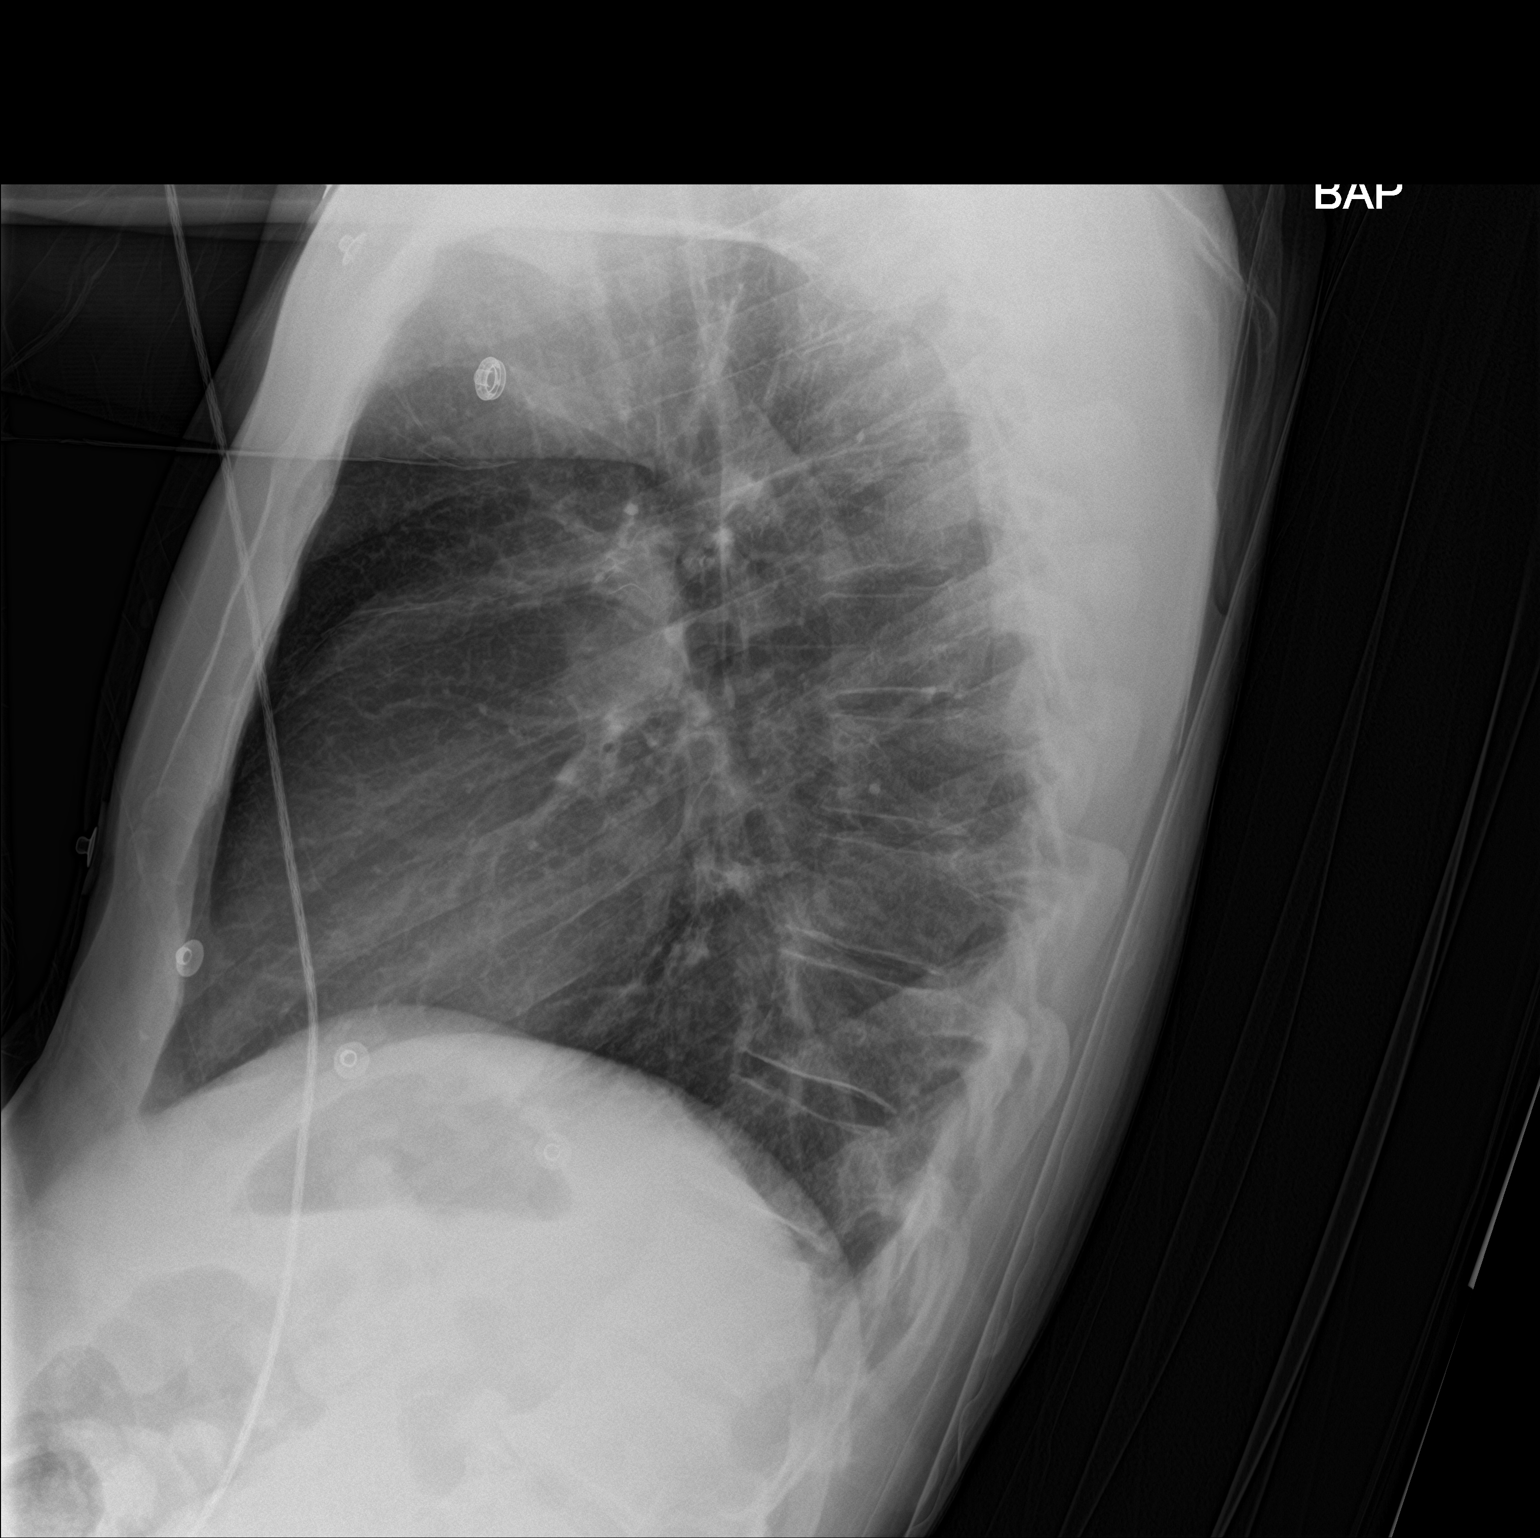

[2 of 2 positions shown; findings below may reference images not displayed]

FINDINGS: The cardiomediastinal silhouette is unremarkable.

There is no evidence of focal airspace disease, pulmonary edema,
suspicious pulmonary nodule/mass, pleural effusion, or pneumothorax.
No acute bony abnormalities are identified.
IMPRESSION: No active cardiopulmonary disease.

## 2019-10-14 ENCOUNTER — Ambulatory Visit: Payer: Self-pay | Attending: Internal Medicine

## 2019-10-14 DIAGNOSIS — Z20822 Contact with and (suspected) exposure to covid-19: Secondary | ICD-10-CM

## 2019-10-15 LAB — NOVEL CORONAVIRUS, NAA: SARS-CoV-2, NAA: NOT DETECTED

## 2021-08-17 ENCOUNTER — Telehealth: Payer: Self-pay | Admitting: *Deleted

## 2021-08-17 NOTE — Telephone Encounter (Signed)
Patient contacted the office requesting an appt to be seen for potential blood clot. Per patient, he was seen by Dr. Edwyna Shell in 1999. Patient states he is experiencing the same kind of pain in the same right shoulder as he did when he was diagnosed with a blood clot. Advised patient to seek care through an urgent care as he does not have a PCP for further evaluation. Patient verbalizes understanding.
